# Patient Record
Sex: Male | Born: 1985 | Race: Black or African American | Hispanic: No | Marital: Married | Smoking: Never smoker
Health system: Southern US, Community
[De-identification: ages and names within clinical notes are randomized; demographics above are authoritative.]

---
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
## 2015-06-04 ENCOUNTER — Encounter (HOSPITAL_BASED_OUTPATIENT_CLINIC_OR_DEPARTMENT_OTHER): Payer: Self-pay | Admitting: Emergency Medicine

## 2015-06-04 ENCOUNTER — Emergency Department (HOSPITAL_BASED_OUTPATIENT_CLINIC_OR_DEPARTMENT_OTHER)
Admission: EM | Admit: 2015-06-04 | Discharge: 2015-06-04 | Disposition: A | Discharge status: Home / Self Care | Payer: Worker's Compensation | Attending: Emergency Medicine | Admitting: Emergency Medicine

## 2015-06-04 DIAGNOSIS — M545 Low back pain, unspecified: Secondary | ICD-10-CM

## 2015-06-04 MED ORDER — KETOROLAC TROMETHAMINE 60 MG/2ML IM SOLN
60.0000 mg | Freq: Once | INTRAMUSCULAR | Status: AC
  Administered 2015-06-04: 60 mg via INTRAMUSCULAR
  Filled 2015-06-04: qty 2

## 2015-06-04 MED ORDER — IBUPROFEN 600 MG PO TABS: 600.0000 mg | ORAL_TABLET | Freq: Four times a day (QID) | ORAL | Status: DC | PRN

## 2015-06-04 MED ORDER — KETOROLAC TROMETHAMINE 60 MG/2ML IM SOLN
INTRAMUSCULAR | Status: AC
  Filled 2015-06-04: qty 2

## 2015-06-04 MED ORDER — CYCLOBENZAPRINE HCL 10 MG PO TABS: 10.0000 mg | ORAL_TABLET | Freq: Two times a day (BID) | ORAL | Status: AC | PRN

## 2015-06-04 NOTE — ED Provider Notes (Signed)
CSN: [id number]     Arrival date & time 06/04/15  1206 History   First MD Initiated Contact with Patient 06/04/15 1222     Chief Complaint  Patient presents with  . Back Pain     (Consider location/radiation/quality/duration/timing/severity/associated sxs/prior Treatment) HPI Barry Watson is a 29 y.o. male because of her evaluation of low back pain. Patient states yesterday at work he was picking up boxes at an awkward angle when he felt a sudden onset, sharp pain in his lower right back. He is taken Excedrin with some relief of his discomfort. He denies any fevers, chills, numbness or weakness or saddle paresthesias, loss of bowel or bladder function. He reports getting in and out of the car and bending over at certain angles exacerbate his symptoms. Rates his overall discomfort as a 5/10. No other aggravating or modifying factors.  History reviewed. No pertinent past medical history. History reviewed. No pertinent past surgical history. History reviewed. No pertinent family history. Social History  Substance Use Topics  . Smoking status: Never Smoker   . Smokeless tobacco: None  . Alcohol Use: No    Review of Systems A 10 point review of systems was completed and was negative except for pertinent positives and negatives as mentioned in the history of present illness     Allergies  Review of patient's allergies indicates no known allergies.  Home Medications   Prior to Admission medications   Medication Sig Start Date End Date Taking? Authorizing Provider  acetaminophen (TYLENOL) 500 MG tablet Take 500 mg by mouth every 6 (six) hours as needed.   Yes Historical Provider, MD  cyclobenzaprine (FLEXERIL) 10 MG tablet Take 1 tablet (10 mg total) by mouth 2 (two) times daily as needed for muscle spasms. 06/04/15   Joycie Peek, PA-C  ibuprofen (ADVIL,MOTRIN) 600 MG tablet Take 1 tablet (600 mg total) by mouth every 6 (six) hours as needed. 06/04/15   Louie Flenner, PA-C    BP 135/100 mmHg  Pulse 63  Temp(Src) 98.3 F (36.8 C) (Oral)  Resp 18  Ht  (1.727 m)  Wt 190 lb (86.183 kg)  BMI 28.90 kg/m2  SpO2 100% Physical Exam  Constitutional:  Awake, alert, nontoxic appearance with baseline speech.  HENT:  Head: Atraumatic.  Eyes: Pupils are equal, round, and reactive to light. Right eye exhibits no discharge. Left eye exhibits no discharge.  Neck: Neck supple.  Cardiovascular: Normal rate and regular rhythm.   No murmur heard. Pulmonary/Chest: Effort normal and breath sounds normal. No respiratory distress. He has no wheezes. He has no rales. He exhibits no tenderness.  Abdominal: Soft. Bowel sounds are normal. He exhibits no mass. There is no tenderness. There is no rebound.  Musculoskeletal:       Thoracic back: He exhibits no tenderness.       Lumbar back: He exhibits tenderness.  Baseline range of motion. Tenderness diffusely throughout right paraspinal lumbar musculature with no midline bony tenderness. No lesions or deformities. No crepitance or bony step-offs.  Neurological:  Mental status baseline for patient.  Upper extremity motor strength and sensation intact and symmetric bilaterally. Gait is baseline. No saddle paresthesias.  Skin: No rash noted.  Psychiatric: He has a normal mood and affect.  Nursing note and vitals reviewed.   ED Course  Procedures (including critical care time) Labs Review Labs Reviewed - No data to display  Imaging Review No results found. I have personally reviewed and evaluated these images and lab results as  part of my medical decision-making.   EKG Interpretation None     Meds given in ED:  Medications  ketorolac (TORADOL) injection 60 mg (not administered)    New Prescriptions   CYCLOBENZAPRINE (FLEXERIL) 10 MG TABLET    Take 1 tablet (10 mg total) by mouth 2 (two) times daily as needed for muscle spasms.   IBUPROFEN (ADVIL,MOTRIN) 600 MG TABLET    Take 1 tablet (600 mg total) by mouth  every 6 (six) hours as needed.   Filed Vitals:   06/04/15 1212  BP: 135/100  Pulse: 63  Temp: 98.3 F (36.8 C)  TempSrc: Oral  Resp: 18  Height:  (1.727 m)  Weight: 190 lb (86.183 kg)  SpO2: 100%    MDM  Patient presents with sudden onset right lower back pain after moving heavy cases of sodas. No back pain red flags. Normal neuro exam with no focal neurodeficits. Gait is baseline. Exam is consistent with MSK injury. Given Toradol in the ED. We'll discharge with anti-inflammatory's and Rice therapy. No evidence of other acute or emergent pathology at this time. Overall, patient appears well, nontoxic, hemodynamically stable, normal vital signs and is appropriate for discharge. Follow up with PCP as needed for reevaluation. Final diagnoses:  Right-sided low back pain without sciatica        Joycie Peek, PA-C 06/04/15 1249  Mirian Mo, MD 06/07/15 1355

## 2015-06-04 NOTE — ED Notes (Signed)
Denies any numbness, tingling in either leg

## 2015-06-04 NOTE — ED Notes (Signed)
Pt was at work on [employer] and lifted some soft drink cases and immediately felt pain in lower back, rested on Friday and then on work Saturday pain intensified

## 2015-06-04 NOTE — ED Notes (Signed)
Hurt back on Thursday, pulling cases, case of sodas, felt a sharp pain in the middle of the back area. States pain will radiate down legs, legs become weak, long periods of sitting increases pain

## 2015-06-04 NOTE — Discharge Instructions (Signed)
Please take medications as prescribed. Do not take the cyclobenzaprine before driving or operating machinery. Follow your doctors as needed. Return to ED for worsening symptoms.  Back Pain, Adult Back pain is very common in adults.The cause of back pain is rarely dangerous and the pain often gets better over time.The cause of your back pain may not be known. Some common causes of back pain include:  Strain of the muscles or ligaments supporting the spine.  Wear and tear (degeneration) of the spinal disks.  Arthritis.  Direct injury to the back. For many people, back pain may return. Since back pain is rarely dangerous, most people can learn to manage this condition on their own. HOME CARE INSTRUCTIONS Watch your back pain for any changes. The following actions may help to lessen any discomfort you are feeling:  Remain active. It is stressful on your back to sit or stand in one place for long periods of time. Do not sit, drive, or stand in one place for more than 30 minutes at a time. Take short walks on even surfaces as soon as you are able.Try to increase the length of time you walk each day.  Exercise regularly as directed by your health care provider. Exercise helps your back heal faster. It also helps avoid future injury by keeping your muscles strong and flexible.  Do not stay in bed.Resting more than 1-2 days can delay your recovery.  Pay attention to your body when you bend and lift. The most comfortable positions are those that put less stress on your recovering back. Always use proper lifting techniques, including:  Bending your knees.  Keeping the load close to your body.  Avoiding twisting.  Find a comfortable position to sleep. Use a firm mattress and lie on your side with your knees slightly bent. If you lie on your back, put a pillow under your knees.  Avoid feeling anxious or stressed.Stress increases muscle tension and can worsen back pain.It is important to  recognize when you are anxious or stressed and learn ways to manage it, such as with exercise.  Take medicines only as directed by your health care provider. Over-the-counter medicines to reduce pain and inflammation are often the most helpful.Your health care provider may prescribe muscle relaxant drugs.These medicines help dull your pain so you can more quickly return to your normal activities and healthy exercise.  Apply ice to the injured area:  Put ice in a plastic bag.  Place a towel between your skin and the bag.  Leave the ice on for 20 minutes, 2-3 times a day for the first 2-3 days. After that, ice and heat may be alternated to reduce pain and spasms.  Maintain a healthy weight. Excess weight puts extra stress on your back and makes it difficult to maintain good posture. SEEK MEDICAL CARE IF:  You have pain that is not relieved with rest or medicine.  You have increasing pain going down into the legs or buttocks.  You have pain that does not improve in one week.  You have night pain.  You lose weight.  You have a fever or chills. SEEK IMMEDIATE MEDICAL CARE IF:   You develop new bowel or bladder control problems.  You have unusual weakness or numbness in your arms or legs.  You develop nausea or vomiting.  You develop abdominal pain.  You feel faint.   This information is not intended to replace advice given to you by your health care provider. Make sure you  discuss any questions you have with your health care provider.   Document Released: 08/13/2005 Document Revised: 09/03/2014 Document Reviewed: 12/15/2013 Elsevier Interactive Patient Education [employer].

## 2015-06-04 NOTE — ED Notes (Signed)
States took Excedrin tab this am, approx 0700hrs today

## 2015-12-04 ENCOUNTER — Emergency Department (HOSPITAL_BASED_OUTPATIENT_CLINIC_OR_DEPARTMENT_OTHER): Payer: Self-pay

## 2015-12-04 ENCOUNTER — Encounter (HOSPITAL_BASED_OUTPATIENT_CLINIC_OR_DEPARTMENT_OTHER): Payer: Self-pay | Admitting: Emergency Medicine

## 2015-12-04 ENCOUNTER — Emergency Department (HOSPITAL_BASED_OUTPATIENT_CLINIC_OR_DEPARTMENT_OTHER)
Admission: EM | Admit: 2015-12-04 | Discharge: 2015-12-04 | Disposition: A | Discharge status: Home / Self Care | Payer: Self-pay | Attending: Emergency Medicine | Admitting: Emergency Medicine

## 2015-12-04 DIAGNOSIS — R0789 Other chest pain: Secondary | ICD-10-CM | POA: Insufficient documentation

## 2015-12-04 LAB — CBC WITH DIFFERENTIAL/PLATELET
Basophils Absolute: 0 10*3/uL (ref 0.0–0.1)
Basophils Relative: 0 %
Eosinophils Absolute: 0.1 10*3/uL (ref 0.0–0.7)
Eosinophils Relative: 1 %
HCT: 45.3 % (ref 39.0–52.0)
Hemoglobin: 15 g/dL (ref 13.0–17.0)
Lymphocytes Relative: 18 %
Lymphs Abs: 2 10*3/uL (ref 0.7–4.0)
MCH: 27.6 pg (ref 26.0–34.0)
MCHC: 33.1 g/dL (ref 30.0–36.0)
MCV: 83.3 fL (ref 78.0–100.0)
Monocytes Absolute: 0.7 10*3/uL (ref 0.1–1.0)
Monocytes Relative: 6 %
Neutro Abs: 8.4 10*3/uL — ABNORMAL HIGH (ref 1.7–7.7)
Neutrophils Relative %: 75 %
Platelets: 201 10*3/uL (ref 150–400)
RBC: 5.44 MIL/uL (ref 4.22–5.81)
RDW: 13.7 % (ref 11.5–15.5)
WBC: 11.2 10*3/uL — ABNORMAL HIGH (ref 4.0–10.5)

## 2015-12-04 LAB — BASIC METABOLIC PANEL
Anion gap: 6 (ref 5–15)
BUN: 15 mg/dL (ref 6–20)
CO2: 26 mmol/L (ref 22–32)
Calcium: 9 mg/dL (ref 8.9–10.3)
Chloride: 107 mmol/L (ref 101–111)
Creatinine, Ser: 1.2 mg/dL (ref 0.61–1.24)
GFR calc Af Amer: >60 mL/min (ref >60)
GFR calc non Af Amer: >60 mL/min (ref >60)
Glucose, Bld: 81 mg/dL (ref 65–99)
Potassium: 4.2 mmol/L (ref 3.5–5.1)
Sodium: 139 mmol/L (ref 135–145)

## 2015-12-04 LAB — TROPONIN I: Troponin I: <0.03 ng/mL (ref <0.031)

## 2015-12-04 MED ORDER — IBUPROFEN 800 MG PO TABS: 800.0000 mg | ORAL_TABLET | Freq: Three times a day (TID) | ORAL | Status: DC | PRN

## 2015-12-04 MED ORDER — KETOROLAC TROMETHAMINE 30 MG/ML IJ SOLN
30.0000 mg | Freq: Once | INTRAMUSCULAR | Status: AC
  Administered 2015-12-04: 30 mg via INTRAVENOUS
  Filled 2015-12-04: qty 1

## 2015-12-04 MED ORDER — TRAMADOL HCL 50 MG PO TABS: 50.0000 mg | ORAL_TABLET | Freq: Four times a day (QID) | ORAL | Status: AC | PRN

## 2015-12-04 NOTE — ED Notes (Signed)
Pt ambulated to XRay at this time 

## 2015-12-04 NOTE — ED Provider Notes (Signed)
CSN: [id number]     Arrival date & time 12/04/15  1223 History   First MD Initiated Contact with Patient 12/04/15 1238     Chief Complaint  Patient presents with  . Chest Pain     (Consider location/radiation/quality/duration/timing/severity/associated sxs/prior Treatment) HPI Patient presents to the emergency department withConstant midsternal chest pain that started one 1 day ago.  The patient states that he is also been feeling pain is worse with movement and deep breathing.  Patient states he did not take any medications prior to arrival.  States nothing seems to make the condition better. The patient denies shortness of breath, headache,blurred vision, neck pain, fever, cough, weakness, numbness, dizziness, anorexia, edema, abdominal pain, nausea, vomiting, diarrhea, rash, back pain, dysuria, hematemesis, bloody stool, near syncope, or syncope. History reviewed. No pertinent past medical history. History reviewed. No pertinent past surgical history. History reviewed. No pertinent family history. Social History  Substance Use Topics  . Smoking status: Never Smoker   . Smokeless tobacco: None  . Alcohol Use: No    Review of Systems All other systems negative except as documented in the HPI. All pertinent positives and negatives as reviewed in the HPI.  Allergies  Review of patient's allergies indicates no known allergies.  Home Medications   Prior to Admission medications   Medication Sig Start Date End Date Taking? Authorizing Provider  acetaminophen (TYLENOL) 500 MG tablet Take 500 mg by mouth every 6 (six) hours as needed.    Historical Provider, MD  cyclobenzaprine (FLEXERIL) 10 MG tablet Take 1 tablet (10 mg total) by mouth 2 (two) times daily as needed for muscle spasms. 06/04/15   Joycie PeekBenjamin Cartner, PA-C  ibuprofen (ADVIL,MOTRIN) 600 MG tablet Take 1 tablet (600 mg total) by mouth every 6 (six) hours as needed. 06/04/15   Joycie PeekBenjamin Cartner, PA-C   BP 119/63 mmHg  Pulse 78   Temp(Src) 98.4 F (36.9 C) (Oral)  Resp 22  Ht 5\' 8"  (1.727 m)  Wt 95.255 kg  BMI 31.94 kg/m2  SpO2 99% Physical Exam  Constitutional: He is oriented to person, place, and time. He appears well-developed and well-nourished. No distress.  HENT:  Head: Normocephalic and atraumatic.  Mouth/Throat: Oropharynx is clear and moist.  Eyes: Pupils are equal, round, and reactive to light.  Neck: Normal range of motion. Neck supple.  Cardiovascular: Normal rate, regular rhythm and normal heart sounds.  Exam reveals no gallop and no friction rub.   No murmur heard. Pulmonary/Chest: Effort normal and breath sounds normal. No respiratory distress. He has no wheezes. He exhibits tenderness.  Abdominal: Soft. Bowel sounds are normal. He exhibits no distension. There is no tenderness.  Neurological: He is alert and oriented to person, place, and time. He exhibits normal muscle tone. Coordination normal.  Skin: Skin is warm and dry. No rash noted. No erythema.  Psychiatric: He has a normal mood and affect. His behavior is normal.  Nursing note and vitals reviewed.   ED Course  Procedures (including critical care time) Labs Review Labs Reviewed  CBC WITH DIFFERENTIAL/PLATELET - Abnormal; Notable for the following:    WBC 11.2 (*)    Neutro Abs 8.4 (*)    All other components within normal limits  BASIC METABOLIC PANEL  TROPONIN I    Imaging Review Dg Chest 2 View  12/04/2015  CLINICAL DATA:  Pain EXAM: CHEST  2 VIEW COMPARISON:  None. FINDINGS: The heart size and mediastinal contours are within normal limits. Both lungs are clear. The visualized  skeletal structures are unremarkable. IMPRESSION: No active cardiopulmonary disease. Electronically Signed   By: Gerome Sam III M.D   On: 12/04/2015 13:23   I have personally reviewed and evaluated these images and lab results as part of my medical decision-making.   EKG Interpretation   Date/Time:  Sunday December 04 2015 12:38:59 EDT Ventricular  Rate:  73 PR Interval:  146 QRS Duration: 86 QT Interval:  370 QTC Calculation: 407 R Axis:   80 Text Interpretation:  Normal sinus rhythm with sinus arrhythmia Normal ECG  Artifact No previous ECGs available Confirmed by RANCOUR  MD, STEPHEN  [phone]) on 12/04/2015 12:49:31 PM       Patient most likely has chest wall pain based on the fact that he was lifting at work when he noticed it.  Patient is advised return here as needed.  Told to use heat on his chest  Charlestine Night, PA-C 12/06/15 1553  Glynn Octave, MD 12/07/15 [phone]

## 2015-12-04 NOTE — Discharge Instructions (Signed)
Return here as needed.  Follow-up with your primary care doctor °

## 2015-12-04 NOTE — ED Notes (Signed)
Pt reports that he noted chest tightness to right side and right flank this am felt same tightness but more in right side of back, pain worse with movement and taking a deep breath

## 2015-12-04 NOTE — ED Notes (Signed)
Pt placed on Automatic VS, continuous pulse ox and cardiac monitoring.

## 2016-12-20 ENCOUNTER — Encounter (HOSPITAL_BASED_OUTPATIENT_CLINIC_OR_DEPARTMENT_OTHER): Payer: Self-pay | Admitting: Emergency Medicine

## 2016-12-20 ENCOUNTER — Emergency Department (HOSPITAL_BASED_OUTPATIENT_CLINIC_OR_DEPARTMENT_OTHER)
Admission: EM | Admit: 2016-12-20 | Discharge: 2016-12-20 | Disposition: A | Discharge status: Home / Self Care | Payer: Managed Care, Other (non HMO) | Attending: Emergency Medicine | Admitting: Emergency Medicine

## 2016-12-20 DIAGNOSIS — Y929 Unspecified place or not applicable: Secondary | ICD-10-CM | POA: Diagnosis not present

## 2016-12-20 DIAGNOSIS — Y990 Civilian activity done for income or pay: Secondary | ICD-10-CM | POA: Diagnosis not present

## 2016-12-20 DIAGNOSIS — X500XXA Overexertion from strenuous movement or load, initial encounter: Secondary | ICD-10-CM | POA: Insufficient documentation

## 2016-12-20 DIAGNOSIS — Y9389 Activity, other specified: Secondary | ICD-10-CM | POA: Insufficient documentation

## 2016-12-20 DIAGNOSIS — S39012A Strain of muscle, fascia and tendon of lower back, initial encounter: Secondary | ICD-10-CM

## 2016-12-20 DIAGNOSIS — S3992XA Unspecified injury of lower back, initial encounter: Secondary | ICD-10-CM | POA: Diagnosis present

## 2016-12-20 MED ORDER — MELOXICAM 15 MG PO TABS: 15.0000 mg | ORAL_TABLET | Freq: Every day | ORAL | 0 refills | Status: AC

## 2016-12-20 MED ORDER — BACLOFEN 10 MG PO TABS: 10.0000 mg | ORAL_TABLET | Freq: Three times a day (TID) | ORAL | 0 refills | Status: AC

## 2016-12-20 NOTE — Discharge Instructions (Signed)
Get help right away if: °· You cannot move your fingers. °· You lose feeling in your fingers or your hand. °· Your hand or your fingers turn cold and pale or blue. °· You notice a bad smell coming from your cast. °· You have drainage from underneath your cast. °· You have new stains from blood or drainage seeping through your cast. ° °

## 2016-12-20 NOTE — ED Provider Notes (Signed)
[facility] Provider Note   CSN: [id number] Arrival date & time: 12/20/16  1903  By signing my name below, I, Modena Jansky, attest that this documentation has been prepared under the direction and in the presence of non-physician practitioner, Arthor Captain, PA-C. Electronically Signed: Modena Jansky, Scribe. 12/20/2016. 9:27 PM.  History   Chief Complaint Chief Complaint  Patient presents with  . Back Pain   The history is provided by the patient. No language interpreter was used.   HPI Comments: Barry Watson is a 31 y.o. male who presents to the Emergency Department complaining of constant moderate lower back pain that started 2 days ago. He states his pain started a day after lifting heavy boxes at work. He took Advil PTA without any relief. His non-radiating pain is exacerbated by bending and twisting. Denies any bowel/bladder incontinence, numbness, weakness, or other complaints at this time.    PCP: Ralene Ok, MD  History reviewed. No pertinent past medical history.  There are no active problems to display for this patient.   History reviewed. No pertinent surgical history.     Home Medications    Prior to Admission medications   Medication Sig Start Date End Date Taking? Authorizing Provider  acetaminophen (TYLENOL) 500 MG tablet Take 500 mg by mouth every 6 (six) hours as needed.    Historical Provider, MD  cyclobenzaprine (FLEXERIL) 10 MG tablet Take 1 tablet (10 mg total) by mouth 2 (two) times daily as needed for muscle spasms. 06/04/15   Joycie Peek, PA-C  ibuprofen (ADVIL,MOTRIN) 800 MG tablet Take 1 tablet (800 mg total) by mouth every 8 (eight) hours as needed. 12/04/15   Charlestine Night, PA-C  traMADol (ULTRAM) 50 MG tablet Take 1 tablet (50 mg total) by mouth every 6 (six) hours as needed. 12/04/15   Charlestine Night, PA-C    Family History No family history on file.  Social History Social History  Substance Use Topics  .  Smoking status: Never Smoker  . Smokeless tobacco: Never Used  . Alcohol use Yes     Comment: social     Allergies   Patient has no known allergies.   Review of Systems Review of Systems  Constitutional: Negative for fever.  Musculoskeletal: Positive for back pain (Lower).  Neurological: Negative for weakness and numbness.     Physical Exam Updated Vital Signs BP (!) 146/100 (BP Location: Left Arm)   Pulse 60   Temp 98.3 F (36.8 C) (Oral)   Resp 16   Ht  (1.727 m)   Wt 200 lb (90.7 kg)   SpO2 100%   BMI 30.41 kg/m   Physical Exam  Constitutional: He appears well-developed and well-nourished. No distress.  HENT:  Head: Normocephalic and atraumatic.  Eyes: Conjunctivae are normal.  Neck: Neck supple.  Cardiovascular: Normal rate.   Pulmonary/Chest: Effort normal.  Abdominal: Soft.  Musculoskeletal: Normal range of motion.  No midline spinal tenderness. TTP in the left lumbar paraspinal. ROM limited with flexion, left rotation, and left lateral flexion. No weakness of the extremities. Normal pulse and sensation.   Neurological: He is alert.  Skin: Skin is warm and dry.  Psychiatric: He has a normal mood and affect.  Nursing note and vitals reviewed.    ED Treatments / Results  DIAGNOSTIC STUDIES: Oxygen Saturation is 100% on RA, normal by my interpretation.    COORDINATION OF CARE: 9:31 PM- Pt advised of plan for treatment and pt agrees.  Labs (all labs ordered are  listed, but only abnormal results are displayed) Labs Reviewed - No data to display  EKG  EKG Interpretation None       Radiology No results found.  Procedures Procedures (including critical care time)  Medications Ordered in ED Medications - No data to display   Initial Impression / Assessment and Plan / ED Course  I have reviewed the triage vital signs and the nursing notes.  Pertinent labs & imaging results that were available during my care of the patient were reviewed  by me and considered in my medical decision making (see chart for details).      Patient with back pain.  No neurological deficits and normal neuro exam.  Patient can walk but states is painful.  No loss of bowel or bladder control.  No concern for cauda equina.  No fever, night sweats, weight loss, h/o cancer, IVDU.  RICE protocol and pain medicine indicated and discussed with patient.   Final Clinical Impressions(s) / ED Diagnoses   Final diagnoses:  Strain of lumbar region, initial encounter    New Prescriptions New Prescriptions   No medications on file   I personally performed the services described in this documentation, which was scribed in my presence. The recorded information has been reviewed and is accurate.        Arthor Captain, PA-C 12/24/16 6045    Canary Brim Tegeler, MD 12/24/16 2138

## 2016-12-20 NOTE — ED Triage Notes (Signed)
Lower back pain at work, lifting "heavy boxes" x 2 days

## 2016-12-20 NOTE — ED Notes (Signed)
ED Provider at bedside. 

## 2017-10-26 ENCOUNTER — Other Ambulatory Visit: Payer: Self-pay

## 2017-10-26 ENCOUNTER — Emergency Department (HOSPITAL_BASED_OUTPATIENT_CLINIC_OR_DEPARTMENT_OTHER)
Admission: EM | Admit: 2017-10-26 | Discharge: 2017-10-26 | Disposition: A | Discharge status: Home / Self Care | Payer: Managed Care, Other (non HMO) | Attending: Emergency Medicine | Admitting: Emergency Medicine

## 2017-10-26 ENCOUNTER — Encounter (HOSPITAL_BASED_OUTPATIENT_CLINIC_OR_DEPARTMENT_OTHER): Payer: Self-pay | Admitting: Emergency Medicine

## 2017-10-26 ENCOUNTER — Emergency Department (HOSPITAL_BASED_OUTPATIENT_CLINIC_OR_DEPARTMENT_OTHER): Payer: Managed Care, Other (non HMO)

## 2017-10-26 DIAGNOSIS — J069 Acute upper respiratory infection, unspecified: Secondary | ICD-10-CM | POA: Insufficient documentation

## 2017-10-26 DIAGNOSIS — B9789 Other viral agents as the cause of diseases classified elsewhere: Secondary | ICD-10-CM

## 2017-10-26 DIAGNOSIS — Z79899 Other long term (current) drug therapy: Secondary | ICD-10-CM | POA: Diagnosis not present

## 2017-10-26 DIAGNOSIS — R05 Cough: Secondary | ICD-10-CM | POA: Diagnosis present

## 2017-10-26 MED ORDER — ALBUTEROL SULFATE (2.5 MG/3ML) 0.083% IN NEBU
5.0000 mg | INHALATION_SOLUTION | Freq: Once | RESPIRATORY_TRACT | Status: AC
  Administered 2017-10-26: 5 mg via RESPIRATORY_TRACT
  Filled 2017-10-26: qty 6

## 2017-10-26 MED ORDER — ALBUTEROL SULFATE HFA 108 (90 BASE) MCG/ACT IN AERS: 1.0000 | INHALATION_SPRAY | Freq: Four times a day (QID) | RESPIRATORY_TRACT | 0 refills | Status: AC | PRN

## 2017-10-26 NOTE — ED Provider Notes (Signed)
[facility] EMERGENCY DEPARTMENT Provider Note   CSN: [id number] Arrival date & time: 10/26/17  1621     History   Chief Complaint Chief Complaint  Patient presents with  . Cough    HPI Barry Watson is a 32 y.o. male.  Barry Watson is a 32 y.o. Male who is otherwise healthy, presents with about 3 days of cough, congestion, sore throat, and chills.  Patient reports symptoms have been gradual in onset since Wednesday, and not improving.  He reports he initially just had some chills and congestion then developed cough occasionally productive of mucus and today woke up with a sore throat and was just generally feeling unwell.  He reports 2 of his children have been sick over the past 2 weeks and this week his youngest is experiencing URI symptoms as well.  Patient has been using NyQuil with some improvement in symptoms, wanted to be checked out in case he had the flu.  Patient denies any other aggravating or alleviating factors.      History reviewed. No pertinent past medical history.  There are no active problems to display for this patient.   History reviewed. No pertinent surgical history.     Home Medications    Prior to Admission medications   Medication Sig Start Date End Date Taking? Authorizing Provider  acetaminophen (TYLENOL) 500 MG tablet Take 500 mg by mouth every 6 (six) hours as needed.    [provider]  baclofen (LIORESAL) 10 MG tablet Take 1 tablet (10 mg total) by mouth 3 (three) times daily. 12/20/16   Arthor CaptainHarris, Abigail, PA-C  cyclobenzaprine (FLEXERIL) 10 MG tablet Take 1 tablet (10 mg total) by mouth 2 (two) times daily as needed for muscle spasms. 06/04/15   Cartner, Sharlet SalinaBenjamin, PA-C  meloxicam (MOBIC) 15 MG tablet Take 1 tablet (15 mg total) by mouth daily. Take 1 daily with food. 12/20/16   Arthor CaptainHarris, Abigail, PA-C  traMADol (ULTRAM) 50 MG tablet Take 1 tablet (50 mg total) by mouth every 6 (six) hours as needed. 12/04/15   Charlestine NightLawyer,  Christopher, PA-C    Family History History reviewed. No pertinent family history.  Social History Social History   Tobacco Use  . Smoking status: Never Smoker  . Smokeless tobacco: Never Used  Substance Use Topics  . Alcohol use: Yes    Comment: social  . Drug use: No     Allergies   Patient has no known allergies.   Review of Systems Review of Systems  Constitutional: Negative for chills and fever.  HENT: Positive for congestion, nosebleeds, postnasal drip, rhinorrhea, sinus pressure and sore throat. Negative for ear discharge and ear pain.   Eyes: Negative for discharge, redness and itching.  Respiratory: Positive for cough. Negative for chest tightness, shortness of breath and wheezing.   Cardiovascular: Negative for chest pain.  Gastrointestinal: Negative for abdominal pain, diarrhea, nausea and vomiting.  Skin: Negative for rash.  Neurological: Negative for dizziness, weakness, light-headedness and headaches.     Physical Exam Updated Vital Signs Pulse 69   Temp 98.1 F (36.7 C) (Oral)   Resp 18   Ht 5\' 8"  (1.727 m)   Wt 97.5 kg (215 lb)   SpO2 98%   BMI 32.69 kg/m   Physical Exam  Constitutional: He appears well-developed and well-nourished. No distress.  HENT:  Head: Normocephalic and atraumatic.  TMs clear with good landmarks, moderate nasal mucosa edema with clear rhinorrhea, posterior oropharynx clear and moist, with some erythema,  no edema or exudates, uvula midline  Eyes: Right eye exhibits no discharge. Left eye exhibits no discharge.  Neck: Neck supple.  Cardiovascular: Normal rate, regular rhythm and normal heart sounds.  Pulmonary/Chest: Effort normal and breath sounds normal. No stridor. No respiratory distress. He has no wheezes. He has no rales.  Respirations equal and unlabored, patient able to speak in full sentences, lungs clear to auscultation bilaterally, lungs do sound a bit tight  Abdominal: Soft. Bowel sounds are normal. He  exhibits no distension and no mass. There is no tenderness. There is no guarding.  Lymphadenopathy:    He has no cervical adenopathy.  Neurological: He is alert. Coordination normal.  Skin: Skin is warm and dry. Capillary refill takes less than 2 seconds. He is not diaphoretic.  Psychiatric: He has a normal mood and affect. His behavior is normal.  Nursing note and vitals reviewed.    ED Treatments / Results  Labs (all labs ordered are listed, but only abnormal results are displayed) Labs Reviewed - No data to display  EKG  EKG Interpretation None       Radiology Dg Chest 2 View  Result Date: 10/26/2017 CLINICAL DATA:  Cough for 2 or 3 days. EXAM: CHEST  2 VIEW COMPARISON:  December 04, 2015 FINDINGS: The heart size and mediastinal contours are within normal limits. Both lungs are clear. The visualized skeletal structures are unremarkable. IMPRESSION: No active cardiopulmonary disease. Electronically Signed   By: Gerome Sam III M.D   On: 10/26/2017 17:05    Procedures Procedures (including critical care time)  Medications Ordered in ED Medications  albuterol (PROVENTIL) (2.5 MG/3ML) 0.083% nebulizer solution 5 mg (not administered)     Initial Impression / Assessment and Plan / ED Course  I have reviewed the triage vital signs and the nursing notes.  Pertinent labs & imaging results that were available during my care of the patient were reviewed by me and considered in my medical decision making (see chart for details).  Pt presents with nasal congestion and cough. Pt is well appearing and vitals are normal. Lungs CTA on exam, did sound a little tight, but this improved with albuterol. Pt CXR negative for acute infiltrate. Patients symptoms are consistent with URI, likely viral etiology. Discussed that antibiotics are not indicated for viral infections. Pt will be discharged with symptomatic treatment, pt given prescription for albuterol since this helped in ED.  Verbalizes  understanding and is agreeable with plan. Pt is hemodynamically stable & in NAD prior to dc.  Pt's blood pressure was mildly elevated today, pt is not exhibiting any symptoms to suggest hypertensive urgency or emergency today, will have pt follow up with their PCP in 1 week for blood pressure check. Discussed long term consequences of untreated hypertension with the patient.   Final Clinical Impressions(s) / ED Diagnoses   Final diagnoses:  Viral URI with cough    ED Discharge Orders        Ordered    albuterol (PROVENTIL HFA;VENTOLIN HFA) 108 (90 Base) MCG/ACT inhaler  Every 6 hours PRN     10/26/17 1739       Legrand Rams 10/26/17 1805    Rolland Porter, MD 10/26/17 [phone]

## 2017-10-26 NOTE — ED Triage Notes (Signed)
Patient states that he had chilles at work yesterday at work. Reports that he has 2 sons with the Flu and he "has been feelin it for the past 2 days"  - Patient denies any pain

## 2017-10-26 NOTE — Discharge Instructions (Signed)
Your symptoms are likely caused by a viral upper respiratory infection. Antibiotics are not helpful in treating viral infection, the virus should run its course in about 5--7 days. Please make sure you are drinking plenty of fluids. You can treat your symptoms supportively with tylenol/ibuprofen for fevers and pains, Zyrtec and Flonase to heal with nasal congestion, and over the counter cough syrups and throat lozenges to help with cough. If your symptoms are not improving please follow up with you Primary doctor.   Your blood pressure was elevated today, please follow up with your primary doctor in 1 week for blood pressure recheck.  If you develop persistent fevers, shortness of breath or difficulty breathing, chest pain, severe headache and neck pain, persistent nausea and vomiting or other new or concerning symptoms return to the Emergency department.

## 2018-04-26 IMAGING — CR DG CHEST 2V
2 series · 2 of 2 positions shown · non-contrast
Comparison: December 04, 2015

CLINICAL DATA: Cough for 2 or 3 days.

EXAM:
CHEST  2 VIEW

[w chest pa]
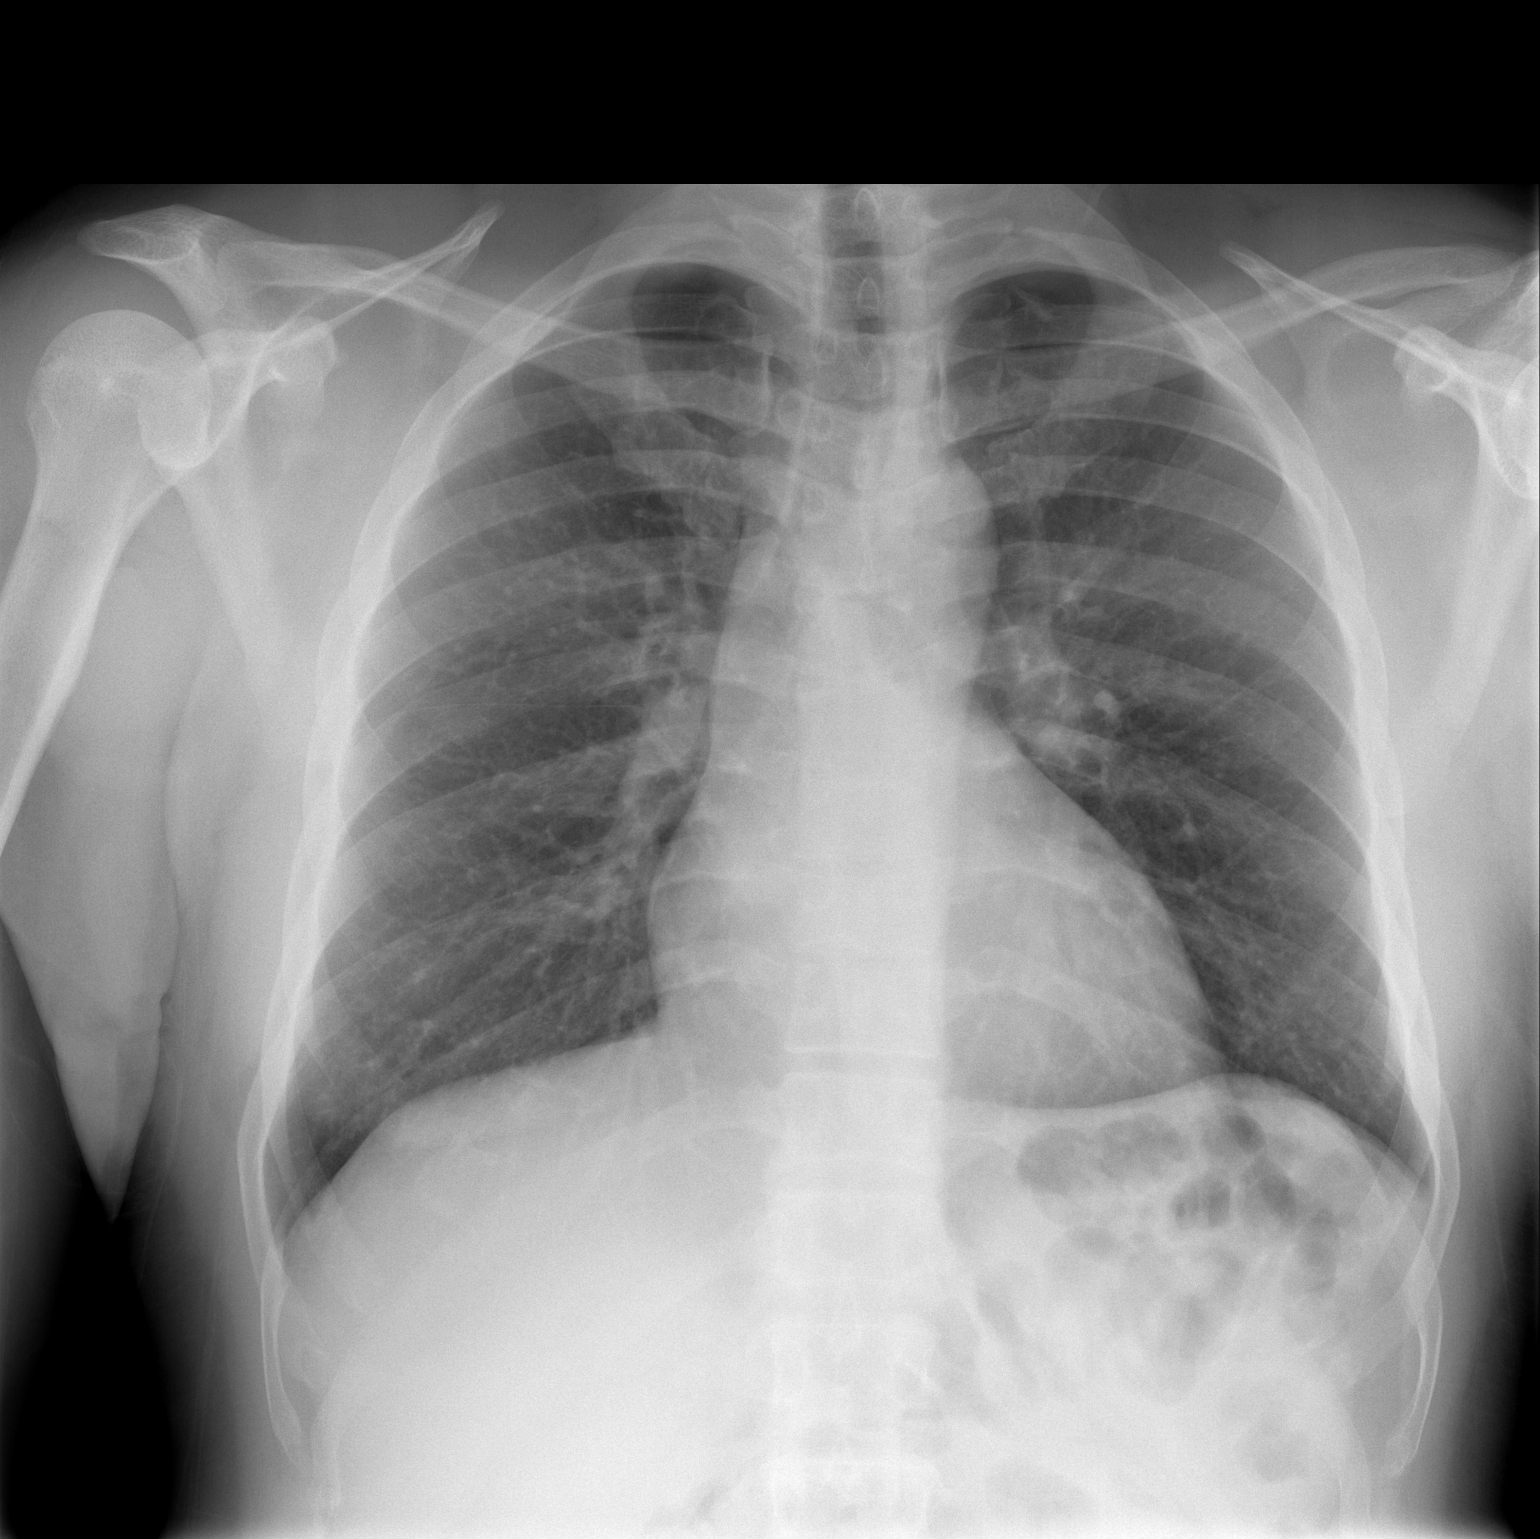

[w chest lat]
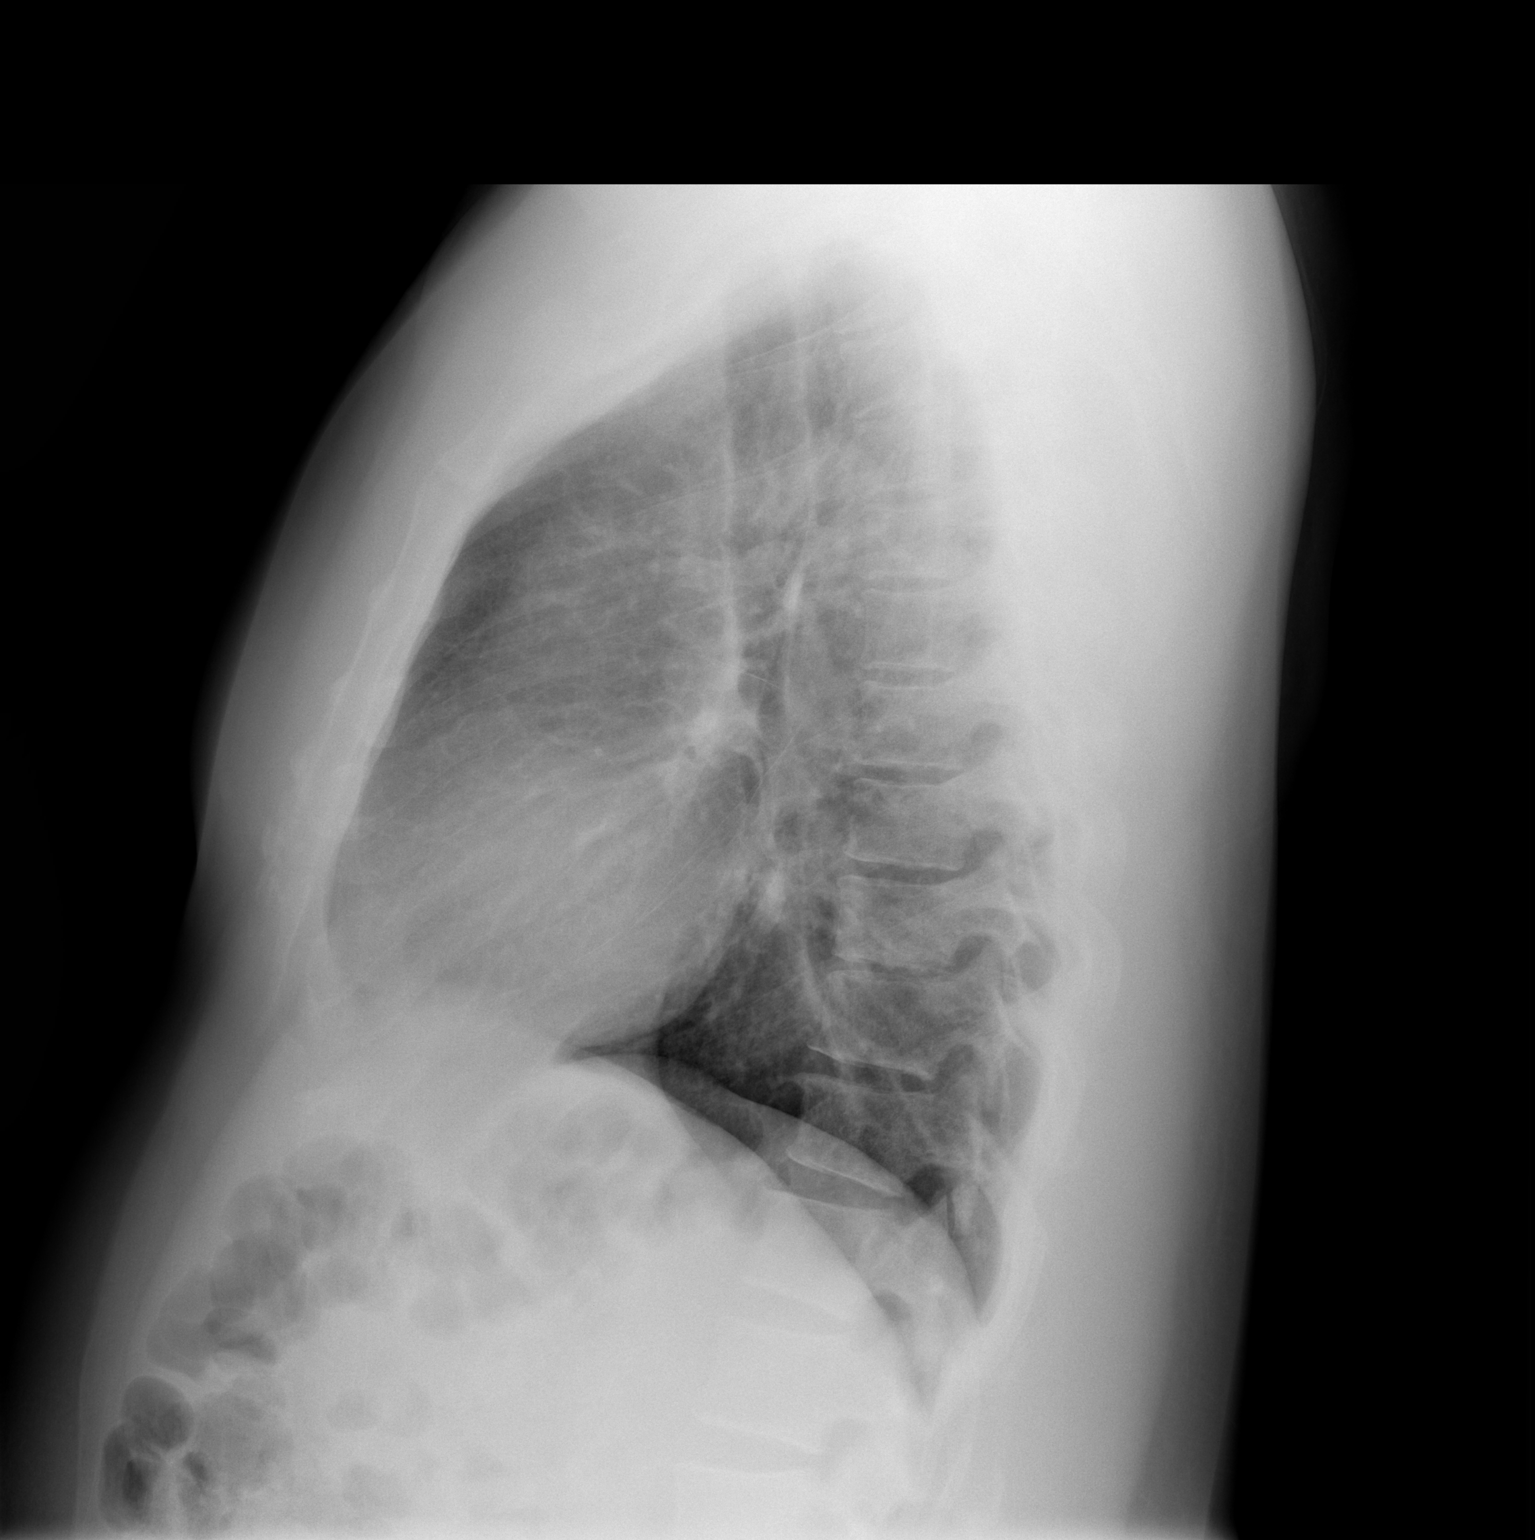

[2 of 2 positions shown; findings below may reference images not displayed]

FINDINGS: The heart size and mediastinal contours are within normal limits.
Both lungs are clear. The visualized skeletal structures are
unremarkable.
IMPRESSION: No active cardiopulmonary disease.

## 2022-08-12 ENCOUNTER — Other Ambulatory Visit: Payer: Self-pay

## 2022-08-12 ENCOUNTER — Encounter (HOSPITAL_BASED_OUTPATIENT_CLINIC_OR_DEPARTMENT_OTHER): Payer: Self-pay | Admitting: Emergency Medicine

## 2022-08-12 ENCOUNTER — Emergency Department (HOSPITAL_BASED_OUTPATIENT_CLINIC_OR_DEPARTMENT_OTHER)
Admission: EM | Admit: 2022-08-12 | Discharge: 2022-08-12 | Disposition: A | Discharge status: Home / Self Care | Payer: Managed Care, Other (non HMO) | Attending: Emergency Medicine | Admitting: Emergency Medicine

## 2022-08-12 DIAGNOSIS — J029 Acute pharyngitis, unspecified: Secondary | ICD-10-CM | POA: Insufficient documentation

## 2022-08-12 DIAGNOSIS — M791 Myalgia, unspecified site: Secondary | ICD-10-CM | POA: Insufficient documentation

## 2022-08-12 DIAGNOSIS — Z1152 Encounter for screening for COVID-19: Secondary | ICD-10-CM | POA: Insufficient documentation

## 2022-08-12 DIAGNOSIS — R059 Cough, unspecified: Secondary | ICD-10-CM | POA: Insufficient documentation

## 2022-08-12 DIAGNOSIS — R0989 Other specified symptoms and signs involving the circulatory and respiratory systems: Secondary | ICD-10-CM | POA: Insufficient documentation

## 2022-08-12 DIAGNOSIS — R030 Elevated blood-pressure reading, without diagnosis of hypertension: Secondary | ICD-10-CM | POA: Insufficient documentation

## 2022-08-12 LAB — RESP PANEL BY RT-PCR (RSV, FLU A&B, COVID)  RVPGX2
Influenza A by PCR: NEGATIVE
Influenza B by PCR: NEGATIVE
Resp Syncytial Virus by PCR: NEGATIVE
SARS Coronavirus 2 by RT PCR: NEGATIVE

## 2022-08-12 LAB — GROUP A STREP BY PCR: Group A Strep by PCR: NOT DETECTED

## 2022-08-12 MED ORDER — IBUPROFEN 400 MG PO TABS
600.0000 mg | ORAL_TABLET | Freq: Once | ORAL | Status: AC
  Administered 2022-08-12: 600 mg via ORAL
  Filled 2022-08-12: qty 1

## 2022-08-12 NOTE — ED Provider Notes (Signed)
[facility] EMERGENCY DEPARTMENT Provider Note   CSN: [id number] Arrival date & time: 08/12/22  1235     History  Chief Complaint  Patient presents with   Cough   HPI Barry Watson is a 36 y.o. male presenting for cough.  States started on Saturday.  Mentioned that daughter has had similar symptoms with cough and sore throat runny nose.  Cough is productive with green sputum.  Endorses that he may have had a subjective fever couple days ago.  Denies chest pain shortness of breath.  Endorsing body aches.  States he took some Mucinex which has helped.   Cough      Home Medications Prior to Admission medications   Medication Sig Start Date End Date Taking? Authorizing Provider  acetaminophen (TYLENOL) 500 MG tablet Take 500 mg by mouth every 6 (six) hours as needed.    [provider]  albuterol (PROVENTIL HFA;VENTOLIN HFA) 108 (90 Base) MCG/ACT inhaler Inhale 1-2 puffs into the lungs every 6 (six) hours as needed for wheezing or shortness of breath. 10/26/17   Dartha Lodge, PA-C  baclofen (LIORESAL) 10 MG tablet Take 1 tablet (10 mg total) by mouth 3 (three) times daily. 12/20/16   Arthor Captain, PA-C  cyclobenzaprine (FLEXERIL) 10 MG tablet Take 1 tablet (10 mg total) by mouth 2 (two) times daily as needed for muscle spasms. 06/04/15   Cartner, Sharlet Salina, PA-C  meloxicam (MOBIC) 15 MG tablet Take 1 tablet (15 mg total) by mouth daily. Take 1 daily with food. 12/20/16   Arthor Captain, PA-C  traMADol (ULTRAM) 50 MG tablet Take 1 tablet (50 mg total) by mouth every 6 (six) hours as needed. 12/04/15   Lawyer, Cristal Deer, PA-C      Allergies    Patient has no known allergies.    Review of Systems   Review of Systems  Respiratory:  Positive for cough.     Physical Exam Updated Vital Signs BP (!) 157/121 (BP Location: Left Arm)   Pulse 96   Temp 98.2 F (36.8 C) (Oral)   Resp 18   Wt 108.9 kg   SpO2 97%   BMI 36.49 kg/m  Physical Exam Vitals and  nursing note reviewed.  HENT:     Head: Normocephalic and atraumatic.     Mouth/Throat:     Mouth: Mucous membranes are moist.     Pharynx: Uvula midline. Pharyngeal swelling and posterior oropharyngeal erythema present. No oropharyngeal exudate.  Eyes:     General:        Right eye: No discharge.        Left eye: No discharge.     Conjunctiva/sclera: Conjunctivae normal.  Cardiovascular:     Rate and Rhythm: Normal rate and regular rhythm.     Pulses: Normal pulses.     Heart sounds: Normal heart sounds.  Pulmonary:     Effort: Pulmonary effort is normal.     Breath sounds: Normal breath sounds.  Abdominal:     General: Abdomen is flat.     Palpations: Abdomen is soft.  Skin:    General: Skin is warm and dry.  Neurological:     General: No focal deficit present.  Psychiatric:        Mood and Affect: Mood normal.     ED Results / Procedures / Treatments   Labs (all labs ordered are listed, but only abnormal results are displayed) Labs Reviewed  GROUP A STREP BY PCR  RESP PANEL BY RT-PCR (RSV,  FLU A&B, COVID)  [id number]    EKG None  Radiology No results found.  Procedures Procedures    Medications Ordered in ED Medications  ibuprofen (ADVIL) tablet 600 mg (has no administration in time range)    ED Course/ Medical Decision Making/ A&P                           Medical Decision Making  36 year old who is well-appearing and hemodynamically stable presenting for cough.  Physical exam was unremarkable but did reveal elevated blood.  Respiratory panel revealed that he was negative for flu, COVID and RSV.  Also considered strep but unlikely given negative strep pcr and no exudate on exam.  Also considered pneumonia but unlikely given clear sounding lungs on exam and no fever today. Symptoms however are consistent with possible upper respiratory viral infection given that he has had recent exposure to his daughter with similar symptoms.  Advised conservative treatment  at home.  Treated his symptoms today with ibuprofen.  Did discuss his elevated blood pressure and advised him to follow-up with his PCP.  Patient denied chest pain and shortness of breath, visual disturbances and headache.        Final Clinical Impression(s) / ED Diagnoses Final diagnoses:  Cough, unspecified type  Elevated blood pressure reading    Rx / DC Orders ED Discharge Orders     None         Harriet Pho, PA-C 08/12/22 1425    Long, Wonda Olds, MD 08/19/22 [phone]

## 2022-08-12 NOTE — Discharge Instructions (Addendum)
Evaluation for your cough is overall reassuring.  Respiratory panel was negative for flu, COVID and RSV.  However it is likely given that you have had recent exposure to your daughter who has had similar symptoms.  Your symptoms could be related to a ongoing viral URI.  Recommend they continue conservative treatment at home.  Also recommend that you follow-up with your PCP for your elevated blood pressure today during this encounter.

## 2022-08-12 NOTE — ED Notes (Signed)
Cough x1 week, theraflu no improvement. Losing voice, Covid test at home negative, wants something for the cough.

## 2022-08-12 NOTE — ED Triage Notes (Signed)
Pt arrives pov, steady gait, c/o cough and sore throat x 1 week.
